# Patient Record
Sex: Female | Born: 1995 | Race: White | Hispanic: No | Marital: Single | Smoking: Never smoker
Health system: Southern US, Community
[De-identification: ages and names within clinical notes are randomized; demographics above are authoritative.]

## PROBLEM LIST (undated history)

## (undated) DIAGNOSIS — T8859XA Other complications of anesthesia, initial encounter: Secondary | ICD-10-CM

## (undated) DIAGNOSIS — T4145XA Adverse effect of unspecified anesthetic, initial encounter: Secondary | ICD-10-CM

## (undated) HISTORY — PX: OTHER SURGICAL HISTORY: SHX169

---
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
## 2017-06-28 ENCOUNTER — Ambulatory Visit: Payer: Self-pay | Admitting: Family Medicine

## 2017-07-25 ENCOUNTER — Ambulatory Visit: Payer: Self-pay | Admitting: Family Medicine

## 2017-07-27 ENCOUNTER — Encounter: Payer: Self-pay | Admitting: Family Medicine

## 2017-07-27 ENCOUNTER — Ambulatory Visit (INDEPENDENT_AMBULATORY_CARE_PROVIDER_SITE_OTHER): Payer: Self-pay | Admitting: Family Medicine

## 2017-07-27 ENCOUNTER — Other Ambulatory Visit: Payer: Self-pay

## 2017-07-27 DIAGNOSIS — Z23 Encounter for immunization: Secondary | ICD-10-CM

## 2017-07-27 DIAGNOSIS — F418 Other specified anxiety disorders: Secondary | ICD-10-CM

## 2017-07-27 MED ORDER — PROPRANOLOL HCL 10 MG PO TABS: 10.0000 mg | ORAL_TABLET | Freq: Two times a day (BID) | ORAL | 3 refills | Status: DC | PRN

## 2017-07-27 NOTE — Progress Notes (Signed)
   Subjective:    Patient ID: Bonnie [name], female    DOB: 20-Jun-1996, 21 y.o.   MRN: [id number]  HPI Re-establishing care.  Anxiety- was treated for this during college w/ Propranolol.  Pt reports she did not use this daily and it was more situational.  Pt felt this worked 'really well'.  Thinks she was on 10mg  PRN.  Has not been on other anxiety or depression meds as this was sufficient.      Review of Systems For ROS see HPI     Objective:   Physical Exam  Constitutional: She is oriented to person, place, and time. She appears well-developed and well-nourished. No distress.  HENT:  Head: Normocephalic and atraumatic.  Eyes: Conjunctivae and EOM are normal. Pupils are equal, round, and reactive to light.  Neck: Normal range of motion. Neck supple. No thyromegaly present.  Cardiovascular: Normal rate, regular rhythm, normal heart sounds and intact distal pulses.  No murmur heard. Pulmonary/Chest: Effort normal and breath sounds normal. No respiratory distress.  Abdominal: Soft. She exhibits no distension. There is no tenderness.  Musculoskeletal: She exhibits no edema.  Lymphadenopathy:    She has no cervical adenopathy.  Neurological: She is alert and oriented to person, place, and time.  Skin: Skin is warm and dry.  Psychiatric: She has a normal mood and affect. Her behavior is normal.  Vitals reviewed.         Assessment & Plan:

## 2017-07-27 NOTE — Assessment & Plan Note (Signed)
New.  Pt was previously being treated w/ Propranolol while at [facility] and per pt report, 'it worked really well'.  Restart Propranolol 10mg  prn.  Reviewed stress management w/ pt.  Will follow.

## 2017-07-27 NOTE — Patient Instructions (Signed)
Schedule your complete physical with pap at your convenience (when you know you'll be home) Restart the Propranolol as needed Call with any questions or concerns Welcome Back! Happy Holidays!!!

## 2017-08-30 ENCOUNTER — Telehealth: Payer: Self-pay | Admitting: Family Medicine

## 2017-08-30 ENCOUNTER — Other Ambulatory Visit: Payer: Self-pay | Admitting: General Practice

## 2017-08-30 MED ORDER — LO LOESTRIN FE 1 MG-10 MCG / 10 MCG PO TABS: 1.0000 | ORAL_TABLET | Freq: Every day | ORAL | 2 refills | Status: DC

## 2017-08-30 NOTE — Telephone Encounter (Signed)
Medication filled for 1 package with 2 refills.

## 2017-08-30 NOTE — Telephone Encounter (Signed)
Copied from [place]. Topic: Quick Communication - See Telephone Encounter >> Aug 30, 2017  4:09 PM Ether Griffins B wrote: CRM for notification. See Telephone encounter for:  Pt needs a one month supply of birth control (Lo Loestrin) due to physical not being until 11/16/17. Pt has a one month supply left and just needs one more until she can be seen by Dr. Birdie Riddle. Pt would like a call to if Dr. Birdie Riddle can do this or not. [facility] to [facility] if able to fill.  08/30/17.

## 2017-09-27 ENCOUNTER — Telehealth: Payer: Self-pay | Admitting: Family Medicine

## 2017-09-27 NOTE — Telephone Encounter (Signed)
Copied from [place]. Topic: Quick Communication - Rx Refill/Question >> Sep 27, 2017  4:01 PM Malena Catholic I, NT wrote: Medication:LO loestrin Fe 1 Mg 58mcg/10 Tablet   Has the patient contacted their pharmacy yes    (Agent: If no, request that the patient contact the pharmacy for the refill.ye   Preferred Pharmacy (with phone number or street name [facility] [phone] Fax [phone]   Agent: Please be advised that RX refills may take up to 3 business days. We ask that you follow-up with your pharmacy.

## 2017-09-28 NOTE — Telephone Encounter (Signed)
This was just sent on 1/2 with two refills - right?

## 2017-09-28 NOTE — Telephone Encounter (Signed)
Medication was filled 08/30/17 with refills. I have been unable to contact pt as voicemail is full on all lines. Tried again today.

## 2017-10-05 NOTE — Telephone Encounter (Signed)
Contacted pt regarding prescription refill request; she states that Dr Birdie Riddle was to send refill to [facility] but was not there; spoke with Ed at [facility] and he states that the prescription is in his queue to be processed and filled; pt placed on conference called and information relayed by Ed; pt verbalizes understanding.

## 2017-10-05 NOTE — Telephone Encounter (Signed)
Patient said she needs her LO LOESTRIN FE 1 MG-10 MCG / 10 MCG tablet refilled. Advised her that she has been contacted several times and couldn't reach her  [place], [place]

## 2017-11-16 ENCOUNTER — Encounter: Payer: BC Managed Care – PPO | Admitting: Family Medicine

## 2018-03-07 ENCOUNTER — Encounter: Payer: BC Managed Care – PPO | Admitting: Family Medicine

## 2018-03-14 ENCOUNTER — Encounter: Payer: Self-pay | Admitting: Family Medicine

## 2018-03-14 ENCOUNTER — Ambulatory Visit (INDEPENDENT_AMBULATORY_CARE_PROVIDER_SITE_OTHER): Payer: BC Managed Care – PPO | Admitting: Family Medicine

## 2018-03-14 ENCOUNTER — Other Ambulatory Visit: Payer: Self-pay

## 2018-03-14 DIAGNOSIS — Z0000 Encounter for general adult medical examination without abnormal findings: Secondary | ICD-10-CM

## 2018-03-14 MED ORDER — LO LOESTRIN FE 1 MG-10 MCG / 10 MCG PO TABS: 1.0000 | ORAL_TABLET | Freq: Every day | ORAL | 11 refills | Status: DC

## 2018-03-14 NOTE — Patient Instructions (Signed)
Schedule your pap and breast exam at your convenience Keep up the good work on healthy diet and regular exercise- you look great!! Call with any questions or concerns Enjoy the rest of your summer!!!

## 2018-03-14 NOTE — Assessment & Plan Note (Signed)
Pt's PE WNL.  UTD on immunizations.  Due for pap but pt started period today and as this is her first one, she is uncomfortable with this and would like to reschedule.  Given normal BMI and lack of risk factors, no need for labs today.  Anticipatory guidance provided.

## 2018-03-14 NOTE — Progress Notes (Signed)
   Subjective:    Patient ID: Bonnie [name], female    DOB: [birth year], 22 y.o.   MRN: [id number]  HPI CPE- UTD on Tdap.  Pt is down 10 lbs since last visit.  No concerns today.  Has never had pap- was going to do pap today but started menses.  No concerns for STDs.  Currently sexually active- declines testing.   Review of Systems Patient reports no vision/ hearing changes, adenopathy,fever, weight change,  persistant/recurrent hoarseness , swallowing issues, chest pain, palpitations, edema, persistant/recurrent cough, hemoptysis, dyspnea (rest/exertional/paroxysmal nocturnal), gastrointestinal bleeding (melena, rectal bleeding), abdominal pain, significant heartburn, bowel changes, GU symptoms (dysuria, hematuria, incontinence), Gyn symptoms (abnormal  bleeding, pain),  syncope, focal weakness, memory loss, numbness & tingling, skin/hair/nail changes, abnormal bruising or bleeding, anxiety, or depression.     Objective:   Physical Exam General Appearance:    Alert, cooperative, no distress, appears stated age  Head:    Normocephalic, without obvious abnormality, atraumatic  Eyes:    PERRL, conjunctiva/corneas clear, EOM's intact, fundi    benign, both eyes  Ears:    Normal TM's and external ear canals, both ears  Nose:   Nares normal, septum midline, mucosa normal, no drainage    or sinus tenderness  Throat:   Lips, mucosa, and tongue normal; teeth and gums normal  Neck:   Supple, symmetrical, trachea midline, no adenopathy;    Thyroid: no enlargement/tenderness/nodules  Back:     Symmetric, no curvature, ROM normal, no CVA tenderness  Lungs:     Clear to auscultation bilaterally, respirations unlabored  Chest Wall:    No tenderness or deformity   Heart:    Regular rate and rhythm, S1 and S2 normal, no murmur, rub   or gallop  Breast Exam:    Deferred to GYN  Abdomen:     Soft, non-tender, bowel sounds active all four quadrants,    no masses, no organomegaly  Genitalia:    Deferred to  GYN  Rectal:    Extremities:   Extremities normal, atraumatic, no cyanosis or edema  Pulses:   2+ and symmetric all extremities  Skin:   Skin color, texture, turgor normal, no rashes or lesions  Lymph nodes:   Cervical, supraclavicular, and axillary nodes normal  Neurologic:   CNII-XII intact, normal strength, sensation and reflexes    throughout          Assessment & Plan:

## 2018-04-19 ENCOUNTER — Encounter: Payer: Self-pay | Admitting: Family Medicine

## 2018-04-19 ENCOUNTER — Ambulatory Visit (INDEPENDENT_AMBULATORY_CARE_PROVIDER_SITE_OTHER): Payer: BC Managed Care – PPO | Admitting: Family Medicine

## 2018-04-19 ENCOUNTER — Other Ambulatory Visit: Payer: Self-pay

## 2018-04-19 ENCOUNTER — Other Ambulatory Visit (HOSPITAL_COMMUNITY)
Admission: RE | Admit: 2018-04-19 | Discharge: 2018-04-19 | Disposition: A | Discharge status: Home / Self Care | Payer: BC Managed Care – PPO | Source: Ambulatory Visit | Attending: Family Medicine | Admitting: Family Medicine

## 2018-04-19 VITALS — BP 118/86 | HR 75 | Temp 98.0°F | Resp 16 | Ht 67.0 in | Wt 160.0 lb

## 2018-04-19 DIAGNOSIS — Z23 Encounter for immunization: Secondary | ICD-10-CM | POA: Diagnosis not present

## 2018-04-19 DIAGNOSIS — Z124 Encounter for screening for malignant neoplasm of cervix: Secondary | ICD-10-CM | POA: Diagnosis not present

## 2018-04-19 NOTE — Patient Instructions (Signed)
Follow up in 1 year for your next physical or as needed We'll notify you of your pap results and make any changes if needed Keep up the good work on healthy diet and regular exercise- you look great! Call with any questions or concerns Happy Labor Day!!

## 2018-04-19 NOTE — Progress Notes (Signed)
   Subjective:    Patient ID: Bonnie [name], female    DOB: 1996-02-04, 22 y.o.   MRN: [id number]  HPI Pap and breast exam- no concerns today.  No lumps, no vaginal d/c.  No concerns for STDs.  On OCPs.   Review of Systems For ROS see HPI     Objective:   Physical Exam  Constitutional: She appears well-developed and well-nourished. No distress.  Pulmonary/Chest: Right breast exhibits no inverted nipple, no mass, no nipple discharge, no skin change and no tenderness. Left breast exhibits no inverted nipple, no mass, no nipple discharge, no skin change and no tenderness. No breast swelling, tenderness or discharge. Breasts are symmetrical.  Genitourinary: Rectal exam shows no external hemorrhoid and anal tone normal. No breast swelling, tenderness or discharge. There is no rash, tenderness, lesion or injury on the right labia. There is no rash, tenderness, lesion or injury on the left labia. Uterus is not deviated, not enlarged, not fixed and not tender. Cervix exhibits no motion tenderness and no discharge. Right adnexum displays no mass, no tenderness and no fullness. Left adnexum displays no mass, no tenderness and no fullness. No erythema, tenderness or bleeding in the vagina. No foreign body in the vagina. No signs of injury around the vagina. No vaginal discharge found.  Vitals reviewed.         Assessment & Plan:  Well Woman- pap collected and breast exam done.  Pt tolerated first pap w/o difficulty.

## 2018-04-20 ENCOUNTER — Other Ambulatory Visit: Payer: Self-pay

## 2018-04-20 ENCOUNTER — Encounter: Payer: Self-pay | Admitting: Family Medicine

## 2018-04-20 ENCOUNTER — Ambulatory Visit: Payer: Self-pay | Admitting: *Deleted

## 2018-04-20 ENCOUNTER — Ambulatory Visit (INDEPENDENT_AMBULATORY_CARE_PROVIDER_SITE_OTHER): Payer: BC Managed Care – PPO | Admitting: Family Medicine

## 2018-04-20 VITALS — BP 118/72 | HR 68 | Temp 98.4°F | Resp 16 | Ht 67.0 in | Wt 160.8 lb

## 2018-04-20 DIAGNOSIS — M26621 Arthralgia of right temporomandibular joint: Secondary | ICD-10-CM | POA: Diagnosis not present

## 2018-04-20 LAB — CYTOLOGY - PAP
ADEQUACY: ABSENT
DIAGNOSIS: NEGATIVE
HPV: NOT DETECTED

## 2018-04-20 MED ORDER — CYCLOBENZAPRINE HCL 5 MG PO TABS
5.0000 mg | ORAL_TABLET | Freq: Every day | ORAL | 1 refills | Status: DC
Start: 2018-04-20 — End: 2019-02-12

## 2018-04-20 MED ORDER — MELOXICAM 15 MG PO TABS
15.0000 mg | ORAL_TABLET | Freq: Every day | ORAL | 0 refills | Status: DC
Start: 2018-04-20 — End: 2018-05-17

## 2018-04-20 NOTE — Patient Instructions (Signed)
Follow up as needed or as scheduled The pain and swelling of your jaw is due to inflammation from clenching START the Meloxicam once daily for inflammation- take w/ food USE the cyclobenzaprine nightly to prevent spasm- at least until pain has improved Try and OTC bite guard to prevent clenching Ice as needed Call with any questions or concerns DO NOT WORRY!  You look amazing!  This is VERY common!  And we can get you feeling better!!!

## 2018-04-20 NOTE — Telephone Encounter (Signed)
FYI

## 2018-04-20 NOTE — Telephone Encounter (Signed)
Pt called with complaints of right jaw pain/pressure which started on 04/15/18; she says that it got better, but last night her jaw starting swelling, and now the whole right side of her face is swollen; the pt says that she feels a lot of dull/throbbing pressure from her jaw to her temple; nurse triage initiated and recommendations made per protocol to include seeing a physician within 3 days; pt previously offered and accepted appointment at 1300 today with Dr Birdie Riddle, [place], by [place]; the pt verbalizes understanding; will route to office for notification of this upcoming appointment.  Reason for Disposition . [1] Mild facial swelling (puffiness) AND [2] persists > 3 days  Answer Assessment - Initial Assessment Questions 1. ONSET: "When did the swelling start?" (e.g., minutes, hours, days)     Started swelling 04/19/18 around 1900 2. (LOCATION: "What part of the face is swollen?"     Right jaw line )area from chin to jaw 3. SEVERITY: "How swollen is it?"     mild 4. ITCHING: "Is there any itching?" If so, ask: "How much?"   (Scale 1-10; mild, moderate or severe)     no 5. PAIN: "Is the swelling painful to touch?" If so, ask: "How painful is it?"   (Scale 1-10; mild, moderate or severe)    2 out of 10 6. FEVER: "Do you have a fever?" If so, ask: "What is it, how was it measured, and when did it start?"      Not sure 7. CAUSE: "What do you think is causing the face swelling?"     No idea 8. RECURRENT SYMPTOM: "Have you had face swelling before?" If so, ask: "When was the last time?" "What happened that time?"     no 9. OTHER SYMPTOMS: "Do you have any other symptoms?" (e.g., toothache, leg swelling)    Pt says area is red and splotchy, facial pressure  10. PREGNANCY: "Is there any chance you are pregnant?" "When was your last menstrual period?"       No LMP 04/06/18  Protocols used: [facility]

## 2018-04-20 NOTE — Progress Notes (Signed)
   Subjective:    Patient ID: Bonnie [name], female    DOB: 1996/07/07, 22 y.o.   MRN: [id number]  HPI Jaw pain- R sided, yesterday started swelling over jaw and cheekbone.  Now red and hot.  No fevers.  No tooth pain.  Last dental visit 3-4 yrs ago.  Pain is at angle of mandible and radiating up into head causing HA.  No hx of similar.  No ear pain.  No recent trauma.  Woke Sunday w/ pain but thought she had slept strangely (slept on friend's couch).  Was getting better as the week progressed but worsened again last night.  Pain w/ smiling or clenching jaw.   Review of Systems For ROS see HPI     Objective:   Physical Exam  Constitutional: She appears well-developed and well-nourished. No distress.  HENT:  Head: Normocephalic and atraumatic.  TTP over R TMJ, no TTP over L No popping or clicking w/ jaw opening or closing No obvious redness or swelling of jaw Teeth WNL- no obvious cavities or abscess  Vitals reviewed.         Assessment & Plan:  TMJ Arthralgia- new.  Reviewed dx w/ pt and mother.  Discussed causes- stress, clenching, eating crunchy foods, etc- and tx plan.  Reviewed supportive care and red flags that should prompt return.  Pt expressed understanding and is in agreement w/ plan.

## 2018-05-17 ENCOUNTER — Other Ambulatory Visit: Payer: Self-pay | Admitting: Family Medicine

## 2018-06-14 ENCOUNTER — Other Ambulatory Visit: Payer: Self-pay | Admitting: Family Medicine

## 2018-07-12 ENCOUNTER — Other Ambulatory Visit: Payer: Self-pay | Admitting: Family Medicine

## 2018-08-20 ENCOUNTER — Other Ambulatory Visit: Payer: Self-pay

## 2018-08-20 ENCOUNTER — Ambulatory Visit: Payer: BC Managed Care – PPO | Admitting: Family Medicine

## 2018-08-20 ENCOUNTER — Encounter: Payer: Self-pay | Admitting: Family Medicine

## 2018-08-20 VITALS — BP 121/81 | HR 93 | Temp 98.1°F | Resp 16 | Ht 67.0 in | Wt 165.5 lb

## 2018-08-20 DIAGNOSIS — B9689 Other specified bacterial agents as the cause of diseases classified elsewhere: Secondary | ICD-10-CM | POA: Diagnosis not present

## 2018-08-20 DIAGNOSIS — J329 Chronic sinusitis, unspecified: Secondary | ICD-10-CM

## 2018-08-20 MED ORDER — AMOXICILLIN 875 MG PO TABS: 875.0000 mg | ORAL_TABLET | Freq: Two times a day (BID) | ORAL | 0 refills | Status: DC

## 2018-08-20 NOTE — Progress Notes (Signed)
   Subjective:    Patient ID: Bonnie [name], female    DOB: [birth year], 24 y.o.   MRN: [id number]  HPI URI- sxs started ~3 weeks ago.  + facial pain/pressure.  + HA.  + nasal congestion.  No fevers.  No tooth pain.  No ear pain but bilateral fullness.  No cough or sore throat.  No known sick contacts.  Pain worsens when lying down.  No N/V.  Did have classic 2nd sickening last week.  Pt gets on a plane to [place] on 1/3   Review of Systems For ROS see HPI     Objective:   Physical Exam Vitals signs reviewed.  Constitutional:      General: She is not in acute distress.    Appearance: She is well-developed.  HENT:     Head: Normocephalic and atraumatic.     Right Ear: Tympanic membrane normal.     Left Ear: Tympanic membrane normal.     Nose: Mucosal edema and rhinorrhea present.     Right Sinus: Maxillary sinus tenderness present. No frontal sinus tenderness.     Left Sinus: Maxillary sinus tenderness present. No frontal sinus tenderness.     Mouth/Throat:     Pharynx: Uvula midline. Posterior oropharyngeal erythema present. No oropharyngeal exudate.  Eyes:     Conjunctiva/sclera: Conjunctivae normal.     Pupils: Pupils are equal, round, and reactive to light.  Neck:     Musculoskeletal: Normal range of motion and neck supple.  Cardiovascular:     Rate and Rhythm: Normal rate and regular rhythm.     Heart sounds: Normal heart sounds.  Pulmonary:     Effort: Pulmonary effort is normal. No respiratory distress.     Breath sounds: Normal breath sounds. No wheezing.  Lymphadenopathy:     Cervical: No cervical adenopathy.           Assessment & Plan:  Bacterial sinusitis- new.  Pt's sxs and PE consistent w/ infxn.  Start abx.  Reviewed supportive care and red flags that should prompt return.  Pt expressed understanding and is in agreement w/ plan.

## 2018-08-20 NOTE — Patient Instructions (Signed)
Follow up as needed or as scheduled START the Amoxicillin twice daily w/ food Drink plenty of fluids REST! Daily Claritin or Zyrtec will help dry up the congestion in addition to Dayquil/Nyquil Call with any questions or concerns Happy Holidays!! SAFE TRAVELS!!!

## 2018-11-14 ENCOUNTER — Encounter (INDEPENDENT_AMBULATORY_CARE_PROVIDER_SITE_OTHER): Payer: BC Managed Care – PPO | Admitting: Family Medicine

## 2018-11-14 DIAGNOSIS — F419 Anxiety disorder, unspecified: Secondary | ICD-10-CM

## 2018-11-15 MED ORDER — SERTRALINE HCL 25 MG PO TABS
25.0000 mg | ORAL_TABLET | Freq: Every day | ORAL | 3 refills | Status: DC
Start: 2018-11-15 — End: 2019-03-06

## 2018-11-15 NOTE — Telephone Encounter (Signed)
Total time spent reading, reviewing, and responding- 9 minutes Pt reached out via MyChart for consultation Only myself and pt were involved in encounter CC: ANXIETY Hx: pt has hx of anxiety and takes propranolol PRN but given the recent COVID crisis and loss of her job, anxiety has been much higher.  She is asking for daily medication to improve her anxiety.  She is not willing to come for appt b/c mom is immunocompromised due to Lupus. A/P: Start Sertraline daily and adjust dose PRN.  Pt to follow up by phone or MyChart in 4 weeks- sooner if needed.

## 2019-02-09 ENCOUNTER — Other Ambulatory Visit: Payer: Self-pay | Admitting: Family Medicine

## 2019-02-11 NOTE — Telephone Encounter (Signed)
Last OV 08/20/18 Flexeril last filled 04/20/18 #30 with 1

## 2019-02-16 ENCOUNTER — Other Ambulatory Visit: Payer: Self-pay | Admitting: Family Medicine

## 2019-03-06 ENCOUNTER — Other Ambulatory Visit: Payer: Self-pay | Admitting: Family Medicine

## 2019-05-13 ENCOUNTER — Other Ambulatory Visit: Payer: Self-pay

## 2019-05-13 DIAGNOSIS — Z20822 Contact with and (suspected) exposure to covid-19: Secondary | ICD-10-CM

## 2019-05-14 LAB — NOVEL CORONAVIRUS, NAA: SARS-CoV-2, NAA: NOT DETECTED

## 2019-05-29 ENCOUNTER — Other Ambulatory Visit: Payer: Self-pay

## 2019-05-29 DIAGNOSIS — Z20822 Contact with and (suspected) exposure to covid-19: Secondary | ICD-10-CM

## 2019-05-30 LAB — NOVEL CORONAVIRUS, NAA: SARS-CoV-2, NAA: NOT DETECTED

## 2019-06-10 ENCOUNTER — Other Ambulatory Visit: Payer: Self-pay | Admitting: General Practice

## 2019-06-10 MED ORDER — LO LOESTRIN FE 1 MG-10 MCG / 10 MCG PO TABS: 1.0000 | ORAL_TABLET | Freq: Every day | ORAL | 2 refills | Status: DC

## 2019-06-12 ENCOUNTER — Encounter: Payer: Self-pay | Admitting: Family Medicine

## 2019-06-12 ENCOUNTER — Other Ambulatory Visit: Payer: Self-pay

## 2019-06-12 ENCOUNTER — Ambulatory Visit (INDEPENDENT_AMBULATORY_CARE_PROVIDER_SITE_OTHER): Payer: BC Managed Care – PPO | Admitting: Family Medicine

## 2019-06-12 VITALS — Temp 98.2°F | Ht 68.0 in | Wt 152.0 lb

## 2019-06-12 DIAGNOSIS — Z0000 Encounter for general adult medical examination without abnormal findings: Secondary | ICD-10-CM | POA: Diagnosis not present

## 2019-06-12 MED ORDER — PROPRANOLOL HCL 10 MG PO TABS: 10.0000 mg | ORAL_TABLET | Freq: Two times a day (BID) | ORAL | 3 refills | Status: DC | PRN

## 2019-06-12 MED ORDER — SERTRALINE HCL 50 MG PO TABS: 50.0000 mg | ORAL_TABLET | Freq: Every day | ORAL | 3 refills | Status: DC

## 2019-06-12 NOTE — Progress Notes (Signed)
   Virtual Visit via Video   I connected with patient on 06/12/19 at  4:15 PM EDT by a video enabled telemedicine application and verified that I am speaking with the correct person using two identifiers.  Location patient: Home Location provider: [facility]up[facility], Office Persons participating in the virtual visit: Patient, Provider, Paden (Jess B)  I discussed the limitations of evaluation and management by telemedicine and the availability of in person appointments. The patient expressed understanding and agreed to proceed.  Subjective:   HPI:   CPE- UTD on pap, no concerns.  Pt plans on flu shot.  ROS:  Patient reports no vision/ hearing changes, adenopathy,fever, weight change,  persistant/recurrent hoarseness , swallowing issues, chest pain, palpitations, edema, persistant/recurrent cough, hemoptysis, dyspnea (rest/exertional/paroxysmal nocturnal), gastrointestinal bleeding (melena, rectal bleeding), abdominal pain, significant heartburn, bowel changes, GU symptoms (dysuria, hematuria, incontinence), Gyn symptoms (abnormal  bleeding, pain),  syncope, focal weakness, memory loss, numbness & tingling, skin/hair/nail changes, abnormal bruising or bleeding, anxiety, or depression.   Patient Active Problem List   Diagnosis Date Noted  . Physical exam 03/14/2018  . Situational anxiety 07/27/2017  . Flat wart 08/12/2012  . 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 RHINITIS 10/21/2010  . NECK PAIN 02/15/2010    Social History   Tobacco Use  . Smoking status: Never Smoker  . Smokeless tobacco: Never Used  Substance Use Topics  . Alcohol use: Yes    Comment: not regular    Current Outpatient Medications:  .  LO LOESTRIN FE 1 MG-10 MCG / 10 MCG tablet, Take 1 tablet by mouth daily. Please call [phone] to schedule a physical., Disp: 28 tablet, Rfl: 2 .  propranolol (INDERAL) 10 MG tablet, Take 1 tablet (10 mg total) by mouth 2 (two) times daily as needed., Disp: 60  tablet, Rfl: 3 .  sertraline (ZOLOFT) 25 MG tablet, TAKE 1 TABLET(25 MG) BY MOUTH DAILY, Disp: 30 tablet, Rfl: 3  No Known Allergies  Objective:   Temp 98.2 F (36.8 C) (Oral)   Ht 5\' 8"  (1.727 m)   Wt 152 lb (68.9 kg)   BMI 23.11 kg/m  AAOx3, NAD NCAT, EOMI No obvious CN deficits Coloring WNL Pt is able to speak clearly, coherently without shortness of breath or increased work of breathing.  Thought process is linear.  Mood is appropriate.   Assessment and Plan:   CPE- UTD on pap, immunizations (will get flu shot locally in [place]).  No concerns, no need for labs.  Anticipatory guidance provided.    Annye Asa, MD 06/12/2019

## 2019-06-12 NOTE — Progress Notes (Signed)
I have discussed the procedure for the virtual visit with the patient who has given consent to proceed with assessment and treatment.   Jessica L Brodmerkel, CMA     

## 2019-06-21 ENCOUNTER — Other Ambulatory Visit: Payer: Self-pay

## 2019-06-21 ENCOUNTER — Telehealth: Payer: Self-pay

## 2019-06-21 DIAGNOSIS — Z20822 Contact with and (suspected) exposure to covid-19: Secondary | ICD-10-CM

## 2019-06-21 NOTE — Telephone Encounter (Signed)
Called and LMOVM to return call.  

## 2019-06-21 NOTE — Telephone Encounter (Signed)
Patient Name: Bonnie [name] Gender: Female DOB: 08/14/1996 Age: 23 Y 9 M 1 D Return Phone Number: [phone] (Primary) [place] [address] Client [facility] Night - C Client Site [place] - Night Physician Dimple Nanas- MD Contact Type Call Who Is Calling Patient / Member / Family / Caregiver Call Type Triage / Clinical Relationship To Patient Self Return Phone Number [phone] (Primary) Chief Complaint Infection Exposure (non-symptomatic) Reason for Call Symptomatic / Request for Bonnie [name] states that her roommate just tested positive for the corona virus. If she got a negative test would you be able to see her parents. She lives with her parents in [place] and she came to her house in [place]. She wants to know the timeline to safely be back around her parents. She is having no symptoms. Translation No Nurse Assessment Nurse: Loletha Carrow, RN, Ronalee Belts Date/Time (Eastern Time): 06/20/2019 6:53:12 PM Confirm and document reason for call. If symptomatic, describe symptoms. ---caller states: My roommate just tested positive for COVID. I have no symptoms. How do I proceed? Has the patient had close contact with a person known or suspected to have the novel coronavirus illness OR traveled / lives in area with major community spread (including international travel) in the last 14 days from the onset of symptoms? * If Asymptomatic, screen for exposure and travel within the last 14 days. ---Yes Does the patient have any new or worsening symptoms? ---Yes Will a triage be completed? ---Yes Related visit to physician within the last 2 weeks? ---No Does the PT have any chronic conditions? (i.e. diabetes, asthma, this includes High risk factors for pregnancy, etc.) ---No Is the patient pregnant or possibly pregnant? (Ask all females between the ages of 79-55) ---No Is this  a behavioral health or substance abuse call? ---No PLEASE NOTE: All timestamps contained within this report are represented as [place] Standard Time. CONFIDENTIALTY NOTICE: This fax transmission is intended only for the addressee. It contains information that is legally privileged, confidential or otherwise protected from use or disclosure. If you are not the intended recipient, you are strictly prohibited from reviewing, disclosing, copying using or disseminating any of this information or taking any action in reliance on or regarding this information. If you have received this fax in error, please notify us immediately by telephone so that we can arrange for its return to [place]. Phone: [phone], Toll-Free: [phone], Fax: [phone] Page: 2 of 2 Call Id: [id number] Guidelines Guideline Title Affirmed Question Affirmed Notes Nurse Date/Time Eilene Ghazi Time) Coronavirus (COVID-19) - Exposure [1] COVID-19 EXPOSURE (Close Contact) AND [2] within last 14 days BUT [3] NO symptoms Emch, RNRonalee Belts 06/20/2019 6:56:47 PM Disp. Time Eilene Ghazi Time) Disposition Final User 06/20/2019 6:42:53 PM Send To RN Personal Stephanie Coup, RN, Catherine 06/20/2019 6:43:00 PM Send To RN Personal Stephanie Coup, RN, Barnetta Chapel 06/20/2019 7:03:06 PM Home Care Yes Emch, RN, Vicenta Dunning Disagree/Comply Comply Caller Understands Yes PreDisposition Did not know what to do Care Advice Given Per Guideline HOME CARE: * You should be able to treat this at home. * Although you were exposed to COVID-19, you do not currently have any of the common symptoms of COVID-19 infection, such as: cough, fever, and shortness of breath. REASSURANCE AND EDUCATION - EXPOSED, NO SYMPTOMS, LESS THAN 14 DAYS: * COVID-19 starts within 14 days of exposure. * Stay at home (quarantine). Do not go to work until 14 days after the exposure. * MONITOR YOUR SYMPTOMS UNTIL 14 DAYS  HAVE PASSED. Check your temperature two times a day. * Call your healthcare  provider if you develop a cough, fever, or shortness of breath. Call your healthcare provider if you develop any other symptoms compatible with a possible diagnosis of COVID-19. COVID-19 - SYMPTOMS: * COVID-19 most often causes a respiratory illness. * The most common symptoms are: cough, fever, and shortness of breath. * Other less common symptoms are: chills, fatigue, headache, loss of smell or taste, muscle pain, and sore throat. COVID-19 - INFORMATION ABOUT TESTING: * Testing requires a healthcare provider's (e.g., doctor, NP, PA) order, just like all medical tests. MEASURE TEMPERATURE: * Watch for symptoms of cough and fever. * Measure your temperature 2 times each day, until 14 days after exposure. HOME ISOLATION NEEDED IF SYMPTOMS OCCUR: * Isolate yourself at home. * Do NOT allow any visitors. * Do NOT go to work or school. * Do NOT go to religious services, child care centers, shopping, or other public places. CALL BACK (OR CALL YOUR HEALTHCARE PROVIDER) IF: * Fever or feeling feverish occurs within 14 days of COVID-19 exposure. * Cough or difficulty breathing occur within 14 days of COVID-19 exposure. * Other symptoms of COVID-19 infection occur. * You have more questions. CARE ADVICE given per Coronavirus (COVID-19) - Exposure (Adult) guideline

## 2019-06-21 NOTE — Telephone Encounter (Signed)
spoke with pt and she expressed an understanding. Thanked PCP for guidelines.

## 2019-06-21 NOTE — Telephone Encounter (Signed)
I am not sure of the question being asked.  If her roommate just tested + and she had close contact with roommate, she needs to quarantine x14 days (as it can take this long for symptoms to develop).  If she at any time develops symptoms- even mild- she should be tested.  It would not be wise for her to test at this time as the virus can take days to replicate and be detected in the system.  If this is not what she is asking, please clarify w/ pt and I'll do my best to respond.

## 2019-06-22 LAB — NOVEL CORONAVIRUS, NAA: SARS-CoV-2, NAA: NOT DETECTED

## 2019-07-02 ENCOUNTER — Other Ambulatory Visit: Payer: Self-pay | Admitting: General Practice

## 2019-07-02 MED ORDER — SERTRALINE HCL 50 MG PO TABS: 50.0000 mg | ORAL_TABLET | Freq: Every day | ORAL | 3 refills | Status: DC

## 2019-09-02 ENCOUNTER — Other Ambulatory Visit: Payer: Self-pay | Admitting: *Deleted

## 2019-09-02 MED ORDER — LO LOESTRIN FE 1 MG-10 MCG / 10 MCG PO TABS: 1.0000 | ORAL_TABLET | Freq: Every day | ORAL | 3 refills | Status: DC

## 2019-10-01 ENCOUNTER — Encounter: Payer: Self-pay | Admitting: Family Medicine

## 2019-10-18 ENCOUNTER — Other Ambulatory Visit: Payer: Self-pay | Admitting: General Practice

## 2019-10-18 MED ORDER — CYCLOBENZAPRINE HCL 5 MG PO TABS: ORAL_TABLET | ORAL | 1 refills | Status: DC

## 2019-10-18 NOTE — Telephone Encounter (Signed)
Please advise, received a refill request from pt pharmacy for flexeril. I do not show this is an active Rx I pended it for you.

## 2019-11-04 ENCOUNTER — Other Ambulatory Visit: Payer: Self-pay | Admitting: General Practice

## 2019-11-04 MED ORDER — SERTRALINE HCL 50 MG PO TABS: 50.0000 mg | ORAL_TABLET | Freq: Every day | ORAL | 3 refills | Status: DC

## 2019-12-18 ENCOUNTER — Other Ambulatory Visit: Payer: Self-pay | Admitting: General Practice

## 2019-12-18 MED ORDER — LO LOESTRIN FE 1 MG-10 MCG / 10 MCG PO TABS: 1.0000 | ORAL_TABLET | Freq: Every day | ORAL | 3 refills | Status: DC

## 2020-01-16 ENCOUNTER — Other Ambulatory Visit: Payer: Self-pay | Admitting: General Practice

## 2020-01-16 MED ORDER — NORETHIN ACE-ETH ESTRAD-FE 1-20 MG-MCG PO TABS: 1.0000 | ORAL_TABLET | Freq: Every day | ORAL | 11 refills | Status: DC

## 2020-01-20 ENCOUNTER — Encounter: Payer: Self-pay | Admitting: Family Medicine

## 2020-03-15 ENCOUNTER — Other Ambulatory Visit: Payer: Self-pay | Admitting: Family Medicine

## 2020-05-05 ENCOUNTER — Other Ambulatory Visit: Payer: Self-pay | Admitting: Family Medicine

## 2020-05-05 NOTE — Telephone Encounter (Signed)
Please clarify w/ pt b/c we switched due to insurance/cost reasons

## 2020-05-05 NOTE — Telephone Encounter (Signed)
Spoke with pt she advised that she prefers the lo lo estrin. I filled this for her today.

## 2020-05-05 NOTE — Telephone Encounter (Signed)
Please advise we have Loestrin listed in her chart.

## 2020-06-11 ENCOUNTER — Other Ambulatory Visit: Payer: Self-pay | Admitting: Family Medicine

## 2020-06-16 ENCOUNTER — Telehealth: Payer: Self-pay | Admitting: Family Medicine

## 2020-06-16 NOTE — Telephone Encounter (Signed)
Please advise 

## 2020-06-16 NOTE — Telephone Encounter (Signed)
Pt called in stating that she took her [facility] and her anxiety medication this morning about an hr later she vomited. She wanted to know if she should take her medication again or should she be ok

## 2020-06-16 NOTE — Telephone Encounter (Signed)
Pt advised of PCP recommendations. States that she began to feel upset stomach this morning. She hasn't really had an appetite. I advised that she needs to do little sips of fluids, clear diet, and if still no better or if lack of urine output she needs to be evaluated at a facility that culd possibly give IV fluids if concern for dehydration. Pt denies contact with anyone with COVID symptoms.

## 2020-06-16 NOTE — Telephone Encounter (Signed)
She should be fine.  No need to repeat any doses

## 2020-06-16 NOTE — Telephone Encounter (Signed)
Agree w/ advice given 

## 2020-09-06 ENCOUNTER — Other Ambulatory Visit: Payer: Self-pay | Admitting: Family Medicine

## 2020-09-07 ENCOUNTER — Encounter: Payer: Self-pay | Admitting: Family Medicine

## 2020-09-07 NOTE — Telephone Encounter (Signed)
Reviewed in PCP absence. Has not been seen > 1 year. Cannot get refill without follow-up with [facility].

## 2020-11-18 ENCOUNTER — Other Ambulatory Visit: Payer: Self-pay

## 2020-11-18 DIAGNOSIS — F418 Other specified anxiety disorders: Secondary | ICD-10-CM

## 2020-11-18 MED ORDER — SERTRALINE HCL 50 MG PO TABS: ORAL_TABLET | ORAL | 3 refills | Status: DC

## 2020-12-03 ENCOUNTER — Other Ambulatory Visit: Payer: Self-pay

## 2020-12-03 ENCOUNTER — Ambulatory Visit: Payer: 59 | Admitting: Family Medicine

## 2020-12-03 ENCOUNTER — Encounter: Payer: Self-pay | Admitting: Family Medicine

## 2020-12-03 VITALS — BP 122/78 | HR 82 | Temp 99.0°F | Resp 20 | Ht 68.0 in | Wt 175.4 lb

## 2020-12-03 DIAGNOSIS — D225 Melanocytic nevi of trunk: Secondary | ICD-10-CM | POA: Diagnosis not present

## 2020-12-03 MED ORDER — CYCLOBENZAPRINE HCL 5 MG PO TABS: ORAL_TABLET | ORAL | 1 refills | Status: DC

## 2020-12-03 NOTE — Progress Notes (Addendum)
   Subjective:    Patient ID: Bonnie [name], female    DOB: 1996-05-31, 25 y.o.   MRN: [id number]  HPI Skin lesion- pt reports she has had 'a mole forever' in the center of sternum.  Was in a wedding last weekend and the dress rubbed and 'there was blood everywhere'.  Area is not painful but she would like it removed.   Review of Systems For ROS see HPI   This visit occurred during the SARS-CoV-2 public health emergency.  Safety protocols were in place, including screening questions prior to the visit, additional usage of staff PPE, and extensive cleaning of exam room while observing appropriate contact time as indicated for disinfecting solutions.       Objective:   Physical Exam Vitals reviewed.  Constitutional:      General: She is not in acute distress.    Appearance: Normal appearance. She is not ill-appearing.  Skin:    General: Skin is warm and dry.     Comments: Lone nevus (~0.75 cm) in center of sternum w/ irritated/scabbed inferior edge- pt consented to removal, area prepped w/ alcohol, cold spray used, nevus quickly and easily removed w/ Dermablade.  Pt tolerated procedure without difficulty.  Pressure was applied w/ gauze.  Silver nitrate used to cauterize.  Band aid applied  Neurological:     General: No focal deficit present.     Mental Status: She is alert and oriented to person, place, and time.  Psychiatric:        Mood and Affect: Mood normal.        Behavior: Behavior normal.        Thought Content: Thought content normal.           Assessment & Plan:  Irritated nevus- new.  Pt requested removal so this would not bleed again.  Consent obtained.  See brief procedure note above.  Pt was not interested in pathology for mole so it was discarded appropriately.  Pt pleased w/ outcome/results.  Wound care instructions given.

## 2020-12-03 NOTE — Patient Instructions (Signed)
Follow up as needed or as scheduled Keep area clean and pat dry It is common for these to ooze- particularly if you're a bleeder- so you may need to change the spot frequently at first and then it should calm down For the first 2-3 days you can saturate a cotton ball with peroxide and drip it on the area twice daily- pat dry- and then cover Please cover when in direct sunlight until well healed Call with any questions or concerns GOOD LUCK WITH THE MOVE!!!!

## 2021-02-24 ENCOUNTER — Encounter: Payer: Self-pay | Admitting: *Deleted

## 2021-03-09 ENCOUNTER — Other Ambulatory Visit: Payer: Self-pay

## 2021-03-09 ENCOUNTER — Encounter: Payer: Self-pay | Admitting: Family Medicine

## 2021-03-09 DIAGNOSIS — Z3041 Encounter for surveillance of contraceptive pills: Secondary | ICD-10-CM

## 2021-03-09 DIAGNOSIS — Z789 Other specified health status: Secondary | ICD-10-CM

## 2021-03-09 MED ORDER — LO LOESTRIN FE 1 MG-10 MCG / 10 MCG PO TABS: 1.0000 | ORAL_TABLET | Freq: Every day | ORAL | 11 refills | Status: DC

## 2021-03-10 ENCOUNTER — Other Ambulatory Visit: Payer: Self-pay

## 2021-03-10 DIAGNOSIS — F418 Other specified anxiety disorders: Secondary | ICD-10-CM

## 2021-03-10 MED ORDER — SERTRALINE HCL 50 MG PO TABS: ORAL_TABLET | ORAL | 3 refills | Status: DC

## 2021-09-01 ENCOUNTER — Other Ambulatory Visit: Payer: Self-pay

## 2021-09-01 MED ORDER — CYCLOBENZAPRINE HCL 5 MG PO TABS: ORAL_TABLET | ORAL | 1 refills | Status: DC

## 2021-10-08 ENCOUNTER — Other Ambulatory Visit: Payer: Self-pay | Admitting: Family Medicine

## 2021-10-08 DIAGNOSIS — F418 Other specified anxiety disorders: Secondary | ICD-10-CM

## 2021-10-08 MED ORDER — SERTRALINE HCL 50 MG PO TABS: ORAL_TABLET | ORAL | 1 refills | Status: DC

## 2022-02-26 ENCOUNTER — Encounter: Payer: Self-pay | Admitting: Family Medicine

## 2022-02-26 DIAGNOSIS — Z789 Other specified health status: Secondary | ICD-10-CM

## 2022-02-28 MED ORDER — LO LOESTRIN FE 1 MG-10 MCG / 10 MCG PO TABS: 1.0000 | ORAL_TABLET | Freq: Every day | ORAL | 0 refills | Status: DC

## 2022-03-11 ENCOUNTER — Telehealth: Payer: 59 | Admitting: Family Medicine

## 2022-03-24 ENCOUNTER — Encounter: Payer: Self-pay | Admitting: Family Medicine

## 2022-03-24 ENCOUNTER — Telehealth (INDEPENDENT_AMBULATORY_CARE_PROVIDER_SITE_OTHER): Payer: 59 | Admitting: Family Medicine

## 2022-03-24 DIAGNOSIS — F418 Other specified anxiety disorders: Secondary | ICD-10-CM | POA: Diagnosis not present

## 2022-03-24 DIAGNOSIS — M26609 Unspecified temporomandibular joint disorder, unspecified side: Secondary | ICD-10-CM

## 2022-03-24 DIAGNOSIS — Z789 Other specified health status: Secondary | ICD-10-CM | POA: Diagnosis not present

## 2022-03-24 MED ORDER — CYCLOBENZAPRINE HCL 5 MG PO TABS: ORAL_TABLET | ORAL | 6 refills | Status: DC

## 2022-03-24 MED ORDER — LO LOESTRIN FE 1 MG-10 MCG / 10 MCG PO TABS: 1.0000 | ORAL_TABLET | Freq: Every day | ORAL | 3 refills | Status: DC

## 2022-03-24 NOTE — Progress Notes (Signed)
   Virtual Visit via Video   I connected with patient on 03/24/22 at 10:20 AM EDT by a video enabled telemedicine application and verified that I am speaking with the correct person using two identifiers.  Location patient: Home Location provider: [facility], Office Persons participating in the virtual visit: Patient, Provider, Wallace Marcille Blanco C)  I discussed the limitations of evaluation and management by telemedicine and the availability of in person appointments. The patient expressed understanding and agreed to proceed.  Subjective:   HPI:   Situation anxiety- sxs are well controlled w/ as needed use of Propranolol.  Pt doesn't feel she needs a daily controller medication at this time.  Birth control- pt is taking OCPs regularly.  No issues w/ menstrual cycle.    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  She is considering getting botox in the masseter to improve pain and spasm.  ROS:   See pertinent positives and negatives per HPI.  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   RHINITIS 10/21/2010   NECK PAIN 02/15/2010    Social History   Tobacco Use   Smoking status: Never   Smokeless tobacco: Never  Substance Use Topics   Alcohol use: Yes    Comment: not regular    Current Outpatient Medications:    cyclobenzaprine (FLEXERIL) 5 MG tablet, TAKE 1 TABLET(5 MG) BY MOUTH AT BEDTIME, Disp: 30 tablet, Rfl: 1   LO LOESTRIN FE 1 MG-10 MCG / 10 MCG tablet, Take 1 tablet by mouth daily., Disp: 28 tablet, Rfl: 0   propranolol (INDERAL) 10 MG tablet, Take 1 tablet (10 mg total) by mouth 2 (two) times daily as needed., Disp: 60 tablet, Rfl: 3  No Known Allergies  Objective:   There were no vitals taken for this visit. AAOx3, NAD NCAT, EOMI No obvious CN deficits Coloring WNL Pt is able to  speak clearly, coherently without shortness of breath or increased work of breathing.  Thought process is linear.  Mood is appropriate.   Assessment and Plan:   Situational anxiety- ongoing issue for pt.  Currently well controlled w/ Propranolol as needed.  She is not interested in a daily controller medication at this time.  Will continue to follow.  Birth control- pt is doing well on OCPs.  No issues w/ periods or bleeding/clotting concerns.  Refill provided  TMJ- ongoing issue for pt.  She is now considering Botox.  Will refill flexeril for her to use as needed.   Annye Asa, MD 03/24/2022

## 2022-05-05 ENCOUNTER — Encounter: Payer: Self-pay | Admitting: Family Medicine

## 2022-09-12 ENCOUNTER — Encounter: Payer: Self-pay | Admitting: Family Medicine

## 2022-09-13 NOTE — Telephone Encounter (Signed)
Spoke with pt and we scheduled her for a video visit tomorrow

## 2022-09-14 ENCOUNTER — Telehealth: Payer: 59 | Admitting: Family Medicine

## 2022-09-26 ENCOUNTER — Telehealth: Payer: Self-pay

## 2022-09-26 NOTE — Telephone Encounter (Signed)
She probably requested these days b/c she lives out of town and [place] for app[place].  I am happy to add her to one of my same day spots so she can continue to be a patient (I know this will likely exceed my session limits but in this case, I'm ok w/ it)

## 2022-09-26 NOTE — Telephone Encounter (Signed)
Got a request to schedule physical 2/7-2/9 which is not available. Soonest is morning of 10/21/22   LM to have her call back and schedule

## 2023-02-24 ENCOUNTER — Other Ambulatory Visit: Payer: Self-pay | Admitting: Family Medicine

## 2023-02-24 DIAGNOSIS — Z789 Other specified health status: Secondary | ICD-10-CM

## 2023-07-26 ENCOUNTER — Encounter: Payer: Self-pay | Admitting: Family Medicine

## 2023-07-26 ENCOUNTER — Ambulatory Visit: Payer: 59 | Admitting: Family Medicine

## 2023-07-26 ENCOUNTER — Other Ambulatory Visit (HOSPITAL_COMMUNITY)
Admission: RE | Admit: 2023-07-26 | Discharge: 2023-07-26 | Disposition: A | Discharge status: Home / Self Care | Payer: 59 | Source: Ambulatory Visit | Attending: Family Medicine | Admitting: Family Medicine

## 2023-07-26 VITALS — BP 122/68 | HR 100 | Temp 98.2°F | Ht 67.0 in | Wt 171.1 lb

## 2023-07-26 DIAGNOSIS — Z1159 Encounter for screening for other viral diseases: Secondary | ICD-10-CM

## 2023-07-26 DIAGNOSIS — Z0000 Encounter for general adult medical examination without abnormal findings: Secondary | ICD-10-CM

## 2023-07-26 DIAGNOSIS — E663 Overweight: Secondary | ICD-10-CM | POA: Insufficient documentation

## 2023-07-26 DIAGNOSIS — Z124 Encounter for screening for malignant neoplasm of cervix: Secondary | ICD-10-CM | POA: Insufficient documentation

## 2023-07-26 MED ORDER — ESCITALOPRAM OXALATE 10 MG PO TABS: 10.0000 mg | ORAL_TABLET | Freq: Every day | ORAL | 3 refills | Status: AC

## 2023-07-26 NOTE — Assessment & Plan Note (Signed)
Pt's PE WNL.  Pap collected.  Check labs.  Anticipatory guidance provided.

## 2023-07-26 NOTE — Patient Instructions (Signed)
Follow up in 1 year or as needed We'll notify you of your lab results and make any changes if needed Keep up the good work on healthy diet and regular exercise- you look great!! START the Lexapro (Escitalopram) daily for anxiety Call with any questions or concerns Stay Safe!  Stay Healthy! Happy Holidays!!!

## 2023-07-26 NOTE — Progress Notes (Signed)
   Subjective:    Patient ID: Bonnie [name], female    DOB: 11-15-1995, 27 y.o.   MRN: [id number]  HPI CPE- due for pap, Tdap, flu.  Moving to [facility] in Feb- very anxious and overwhelmed  Patient Care Team    Relationship Specialty Notifications Start End  Sheliah Hatch, MD PCP - General Family Medicine  07/27/17     Health Maintenance  Topic Date Due   HIV Screening  Never done   Hepatitis C Screening  Never done   Cervical Cancer Screening (Pap smear)  04/19/2021   DTaP/Tdap/Td (2 - Td or Tdap) 05/31/2021   INFLUENZA VACCINE  03/30/2023   COVID-19 Vaccine (1 - 2023-24 season) 04/30/2023   HPV VACCINES  Aged Out     Review of Systems Patient reports no vision/ hearing changes, adenopathy,fever, weight change,  persistant/recurrent hoarseness , swallowing issues, chest pain, palpitations, edema, persistant/recurrent cough, hemoptysis, dyspnea (rest/exertional/paroxysmal nocturnal), gastrointestinal bleeding (melena, rectal bleeding), abdominal pain, significant heartburn, bowel changes, GU symptoms (dysuria, hematuria, incontinence), Gyn symptoms (abnormal  bleeding, pain),  syncope, focal weakness, memory loss, numbness & tingling, skin/hair/nail changes, abnormal bruising or bleeding, anxiety, or depression.     Objective:   Physical Exam  General Appearance:    Alert, cooperative, no distress, appears stated age  Head:    Normocephalic, without obvious abnormality, atraumatic  Eyes:    PERRL, conjunctiva/corneas clear, EOM's intact both eyes  Ears:    Normal TM's and external ear canals, both ears  Nose:   Nares normal, septum midline, mucosa normal, no drainage    or sinus tenderness  Throat:   Lips, mucosa, and tongue normal; teeth and gums normal  Neck:   Supple, symmetrical, trachea midline, no adenopathy;    Thyroid: no enlargement/tenderness/nodules  Back:     Symmetric, no curvature, ROM normal, no CVA tenderness  Lungs:     Clear to auscultation bilaterally,  respirations unlabored  Chest Wall:    No tenderness or deformity   Heart:    Regular rate and rhythm, S1 and S2 normal, no murmur, rub   or gallop  Breast Exam:    Deferred  Abdomen:     Soft, non-tender, bowel sounds active all four quadrants,    no masses, no organomegaly  Genitalia:    External genitalia normal, cervix normal in appearance, no CMT, uterus in normal size and position, adnexa w/out mass or tenderness, mucosa pink and moist, no lesions or discharge present  Rectal:    Normal external appearance  Extremities:   Extremities normal, atraumatic, no cyanosis or edema  Pulses:   2+ and symmetric all extremities  Skin:   Skin color, texture, turgor normal, no rashes or lesions  Lymph nodes:   Cervical, supraclavicular, and axillary nodes normal  Neurologic:   CNII-XII intact, normal strength, sensation and reflexes    throughout          Assessment & Plan:

## 2023-07-28 LAB — CBC WITH DIFFERENTIAL/PLATELET
Absolute Lymphocytes: 1746 {cells}/uL (ref 850–3900)
Absolute Monocytes: 396 {cells}/uL (ref 200–950)
Basophils Absolute: 42 {cells}/uL (ref 0–200)
Basophils Relative: 0.7 %
Eosinophils Absolute: 150 {cells}/uL (ref 15–500)
Eosinophils Relative: 2.5 %
HCT: 43.2 % (ref 35.0–45.0)
Hemoglobin: 14.3 g/dL (ref 11.7–15.5)
MCH: 30.2 pg (ref 27.0–33.0)
MCHC: 33.1 g/dL (ref 32.0–36.0)
MCV: 91.3 fL (ref 80.0–100.0)
MPV: 11.7 fL (ref 7.5–12.5)
Monocytes Relative: 6.6 %
Neutro Abs: 3666 {cells}/uL (ref 1500–7800)
Neutrophils Relative %: 61.1 %
Platelets: 175 10*3/uL (ref 140–400)
RBC: 4.73 10*6/uL (ref 3.80–5.10)
RDW: 11.2 % (ref 11.0–15.0)
Total Lymphocyte: 29.1 %
WBC: 6 10*3/uL (ref 3.8–10.8)

## 2023-07-28 LAB — HEPATIC FUNCTION PANEL
AG Ratio: 1.9 (calc) (ref 1.0–2.5)
ALT: 16 U/L (ref 6–29)
AST: 18 U/L (ref 10–30)
Albumin: 4.9 g/dL (ref 3.6–5.1)
Alkaline phosphatase (APISO): 51 U/L (ref 31–125)
Bilirubin, Direct: 0.1 mg/dL (ref 0.0–0.2)
Globulin: 2.6 g/dL (ref 1.9–3.7)
Indirect Bilirubin: 0.3 mg/dL (ref 0.2–1.2)
Total Bilirubin: 0.4 mg/dL (ref 0.2–1.2)
Total Protein: 7.5 g/dL (ref 6.1–8.1)

## 2023-07-28 LAB — BASIC METABOLIC PANEL
BUN: 13 mg/dL (ref 7–25)
CO2: 27 mmol/L (ref 20–32)
Calcium: 10.3 mg/dL — ABNORMAL HIGH (ref 8.6–10.2)
Chloride: 104 mmol/L (ref 98–110)
Creat: 0.93 mg/dL (ref 0.50–0.96)
Glucose, Bld: 100 mg/dL — ABNORMAL HIGH (ref 65–99)
Potassium: 4.8 mmol/L (ref 3.5–5.3)
Sodium: 141 mmol/L (ref 135–146)

## 2023-07-28 LAB — TSH: TSH: 0.46 m[IU]/L

## 2023-07-28 LAB — LIPID PANEL
Cholesterol: 204 mg/dL — ABNORMAL HIGH (ref <200)
HDL: 62 mg/dL (ref >=50)
LDL Cholesterol (Calc): 120 mg/dL — ABNORMAL HIGH
Non-HDL Cholesterol (Calc): 142 mg/dL — ABNORMAL HIGH (ref <130)
Total CHOL/HDL Ratio: 3.3 (calc) (ref <5.0)
Triglycerides: 112 mg/dL (ref <150)

## 2023-07-28 LAB — HEPATITIS C ANTIBODY: Hepatitis C Ab: NONREACTIVE

## 2023-07-31 ENCOUNTER — Telehealth: Payer: Self-pay

## 2023-07-31 LAB — CYTOLOGY - PAP
Comment: NEGATIVE
Diagnosis: NEGATIVE
High risk HPV: NEGATIVE

## 2023-07-31 NOTE — Telephone Encounter (Signed)
Pt has reviewed via MyChart

## 2023-07-31 NOTE — Telephone Encounter (Signed)
-----   Message from Neena Rhymes sent at 07/31/2023  1:16 PM EST ----- Labs look good!  Pap is normal- great news!  Continue to work on healthy diet and regular exercise to keep cholesterol and sugar in range (but since labs were done at 3pm I suspect you were not fasting and this is why they are just mildly elevated).  No cause for concern!

## 2023-08-24 ENCOUNTER — Ambulatory Visit: Payer: 59 | Admitting: Nurse Practitioner

## 2023-09-12 ENCOUNTER — Other Ambulatory Visit: Payer: Self-pay | Admitting: Family Medicine

## 2023-09-12 NOTE — Telephone Encounter (Signed)
 Copied from CRM [id number]. Topic: Clinical - Medication Refill >> Sep 12, 2023  9:01 AM Bonnie [name] wrote: Most Recent Primary Care Visit:  Provider: TABORI, KATHERINE E  Department: [facility]  Visit Type: PHYSICAL  Date: 07/26/2023  Medication: cyclobenzaprine  (FLEXERIL ) 5 MG tablet  Has the patient contacted their pharmacy? Yes (Agent: If no, request that the patient contact the pharmacy for the refill. If patient does not wish to contact the pharmacy document the reason why and proceed with request.) (Agent: If yes, when and what did the pharmacy advise?) - Stated she was out of refills  Is this the correct pharmacy for this prescription? Yes If no, delete pharmacy and type the correct one.  This is the patient's preferred pharmacy:   [facility] [address] [place], [place] - [address] [place] [address] Phone: [phone] Fax: [phone]   Has the prescription been filled recently? No  Is the patient out of the medication? No , 2 left  Has the patient been seen for an appointment in the last year OR does the patient have an upcoming appointment? Yes  Can we respond through MyChart? Yes  Agent: Please be advised that Rx refills may take up to 3 business days. We ask that you follow-up with your pharmacy.

## 2023-09-13 MED ORDER — CYCLOBENZAPRINE HCL 5 MG PO TABS: ORAL_TABLET | ORAL | 6 refills | Status: AC

## 2024-01-29 ENCOUNTER — Other Ambulatory Visit: Payer: Self-pay

## 2024-01-29 DIAGNOSIS — Z789 Other specified health status: Secondary | ICD-10-CM

## 2024-01-29 MED ORDER — LO LOESTRIN FE 1 MG-10 MCG / 10 MCG PO TABS: 1.0000 | ORAL_TABLET | Freq: Every day | ORAL | 0 refills | Status: DC

## 2024-02-28 ENCOUNTER — Encounter (INDEPENDENT_AMBULATORY_CARE_PROVIDER_SITE_OTHER): Payer: Self-pay | Admitting: Family Medicine

## 2024-02-28 DIAGNOSIS — F40243 Fear of flying: Secondary | ICD-10-CM | POA: Diagnosis not present

## 2024-02-28 MED ORDER — ALPRAZOLAM 0.5 MG PO TABS: 0.5000 mg | ORAL_TABLET | Freq: Two times a day (BID) | ORAL | 0 refills | Status: DC | PRN

## 2024-02-28 NOTE — Telephone Encounter (Signed)
 Patient asking for anxiety medication for travel based anxiety, please advise should I make patient an acute appt?

## 2024-02-28 NOTE — Telephone Encounter (Signed)
 [facility] VISIT   Patient agreed to [facility] visit and is aware that copayment and coinsurance may apply. Patient was treated using telemedicine according to accepted telemedicine protocols.  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   Allergic rhinitis 10/21/2010   NECK PAIN 02/15/2010   Social History   Tobacco Use   Smoking status: Never   Smokeless tobacco: Never  Substance Use Topics   Alcohol use: Yes    Comment: not regular    Current Outpatient Medications:    ALPRAZolam (XANAX) 0.5 MG tablet, Take 1 tablet (0.5 mg total) by mouth 2 (two) times daily as needed for anxiety., Disp: 30 tablet, Rfl: 0   cyclobenzaprine  (FLEXERIL ) 5 MG tablet, TAKE 1 TABLET(5 MG) BY MOUTH AT BEDTIME, Disp: 30 tablet, Rfl: 6   escitalopram  (LEXAPRO ) 10 MG tablet, Take 1 tablet (10 mg total) by mouth daily., Disp: 30 tablet, Rfl: 3   LO LOESTRIN FE  1 MG-10 MCG / 10 MCG tablet, Take 1 tablet by mouth daily., Disp: 84 tablet, Rfl: 0  No Known Allergies  Assessment and Plan:   Diagnosis: flight anxiety. Please see myChart communication and orders below.   No orders of the defined types were placed in this encounter.  Meds ordered this encounter  Medications   ALPRAZolam (XANAX) 0.5 MG tablet    Sig: Take 1 tablet (0.5 mg total) by mouth 2 (two) times daily as needed for anxiety.    Dispense:  30 tablet    Refill:  0    Comer Greet, MD 02/28/2024  A total of 7 minutes were spent by me to personally review the patient-generated inquiry, review patient records and data pertinent to assessment of the patient's problem, develop a management plan including generation of prescriptions and/or orders,  and on subsequent communication with the patient through secure the MyChart portal service.   There is no separately reported E/M service related to this service in the past 7 days nor does the patient have an upcoming soonest available appointment for this issue. This work was completed in less than 7 days.   The patient consented to this service today (see patient agreement prior to ongoing communication). Patient counseled regarding the need for in-person exam for certain conditions and was advised to call the office if any changing or worsening symptoms occur.   The codes to be used for the E/M service are: [x]   99421 for 5-10 minutes of time spent on the inquiry. []   T7220504 for 11-20 minutes. []   N440385 for 21+ minutes.

## 2024-02-29 MED ORDER — ALPRAZOLAM 0.5 MG PO TABS: 0.5000 mg | ORAL_TABLET | Freq: Two times a day (BID) | ORAL | 0 refills | Status: AC | PRN

## 2024-02-29 NOTE — Addendum Note (Signed)
 Addended by: Lizvette Lightsey E on: 02/29/2024 04:20 PM   Modules accepted: Orders

## 2024-02-29 NOTE — Telephone Encounter (Signed)
 Alprazolam was sent yesterday but to the wrong pharmacy. Please see pharmacy below.   Copied from CRM [id number]. Topic: Clinical - Medication Question >> Feb 29, 2024 10:07 AM Bonnie [name] wrote: Reason for CRM: Patient called in to have prescription ALPRAZolam (XANAX) 0.5 MG tablet sent to [facility] [address] [place] [address] Phone: [phone]

## 2024-04-06 ENCOUNTER — Encounter: Payer: Self-pay | Admitting: Family Medicine

## 2024-04-22 ENCOUNTER — Other Ambulatory Visit: Payer: Self-pay

## 2024-04-22 DIAGNOSIS — Z789 Other specified health status: Secondary | ICD-10-CM

## 2024-04-22 MED ORDER — LO LOESTRIN FE 1 MG-10 MCG / 10 MCG PO TABS: 1.0000 | ORAL_TABLET | Freq: Every day | ORAL | 0 refills | Status: DC

## 2024-08-06 ENCOUNTER — Telehealth: Payer: Self-pay

## 2024-08-06 ENCOUNTER — Other Ambulatory Visit: Payer: Self-pay

## 2024-08-06 DIAGNOSIS — Z789 Other specified health status: Secondary | ICD-10-CM

## 2024-08-06 MED ORDER — LO LOESTRIN FE 1 MG-10 MCG / 10 MCG PO TABS: 1.0000 | ORAL_TABLET | Freq: Every day | ORAL | 0 refills | Status: AC

## 2024-08-06 NOTE — Telephone Encounter (Unsigned)
 Copied from CRM #[id number]. Topic: Clinical - Prescription Issue >> Aug 06, 2024 10:28 AM Aleatha BROCKS wrote: Reason for CRM: LO LOESTRIN FE  1 MG-10 MCG / 10 MCG tablet was denied by [facility] and patient would like it sent to   [id number] - [place], [place] - [address] AT [address] [address] & [address] [place] [address] Phone: [phone] Fax: [phone] Hours: Not open 24 hours

## 2024-08-06 NOTE — Telephone Encounter (Signed)
 Per PCP refill was sent Called pt and scheduled CPE 10/22/2023

## 2024-08-06 NOTE — Telephone Encounter (Signed)
 Refill has been sent.

## 2024-10-21 ENCOUNTER — Encounter: Admitting: Family Medicine
